# Patient Record
Sex: Female | Born: 1965 | Hispanic: No | Marital: Married | Smoking: Never smoker
Health system: Southern US, Community
[De-identification: ages and names within clinical notes are randomized; demographics above are authoritative.]

## PROBLEM LIST (undated history)

## (undated) DIAGNOSIS — F32A Depression, unspecified: Secondary | ICD-10-CM

## (undated) DIAGNOSIS — N979 Female infertility, unspecified: Secondary | ICD-10-CM

## (undated) DIAGNOSIS — R87619 Unspecified abnormal cytological findings in specimens from cervix uteri: Secondary | ICD-10-CM

## (undated) DIAGNOSIS — F329 Major depressive disorder, single episode, unspecified: Secondary | ICD-10-CM

## (undated) HISTORY — DX: Unspecified abnormal cytological findings in specimens from cervix uteri: R87.619

## (undated) HISTORY — DX: Major depressive disorder, single episode, unspecified: F32.9

## (undated) HISTORY — DX: Female infertility, unspecified: N97.9

## (undated) HISTORY — DX: Depression, unspecified: F32.A

## (undated) HISTORY — PX: WISDOM TOOTH EXTRACTION: SHX21

---

## 2007-02-13 ENCOUNTER — Other Ambulatory Visit: Admission: RE | Admit: 2007-02-13 | Discharge: 2007-02-13 | Payer: Self-pay | Admitting: Obstetrics & Gynecology

## 2007-04-10 ENCOUNTER — Ambulatory Visit (HOSPITAL_COMMUNITY): Admission: RE | Admit: 2007-04-10 | Discharge: 2007-04-10 | Payer: Self-pay | Admitting: Obstetrics & Gynecology

## 2007-04-15 ENCOUNTER — Encounter: Admission: RE | Admit: 2007-04-15 | Discharge: 2007-04-15 | Payer: Self-pay | Admitting: Obstetrics & Gynecology

## 2008-01-16 ENCOUNTER — Encounter: Admission: RE | Admit: 2008-01-16 | Discharge: 2008-01-16 | Payer: Self-pay | Admitting: Obstetrics & Gynecology

## 2008-03-18 ENCOUNTER — Other Ambulatory Visit: Admission: RE | Admit: 2008-03-18 | Discharge: 2008-03-18 | Payer: Self-pay | Admitting: Obstetrics and Gynecology

## 2008-04-26 ENCOUNTER — Encounter: Admission: RE | Admit: 2008-04-26 | Discharge: 2008-04-26 | Payer: Self-pay | Admitting: Obstetrics & Gynecology

## 2009-04-23 IMAGING — MG MM DIAGNOSTIC BILATERAL
1 series · 1 of 1 positions shown · non-contrast
Comparison: [DATE] [DATE], [DATE], [DATE] [DATE], [DATE], [DATE] [DATE], [DATE]

CLINICAL DATA: Short-term follow-up left focal asymmetry

DIGITAL DIAGNOSTIC BILATERAL MAMMOGRAM WITH CAD

[L CC]
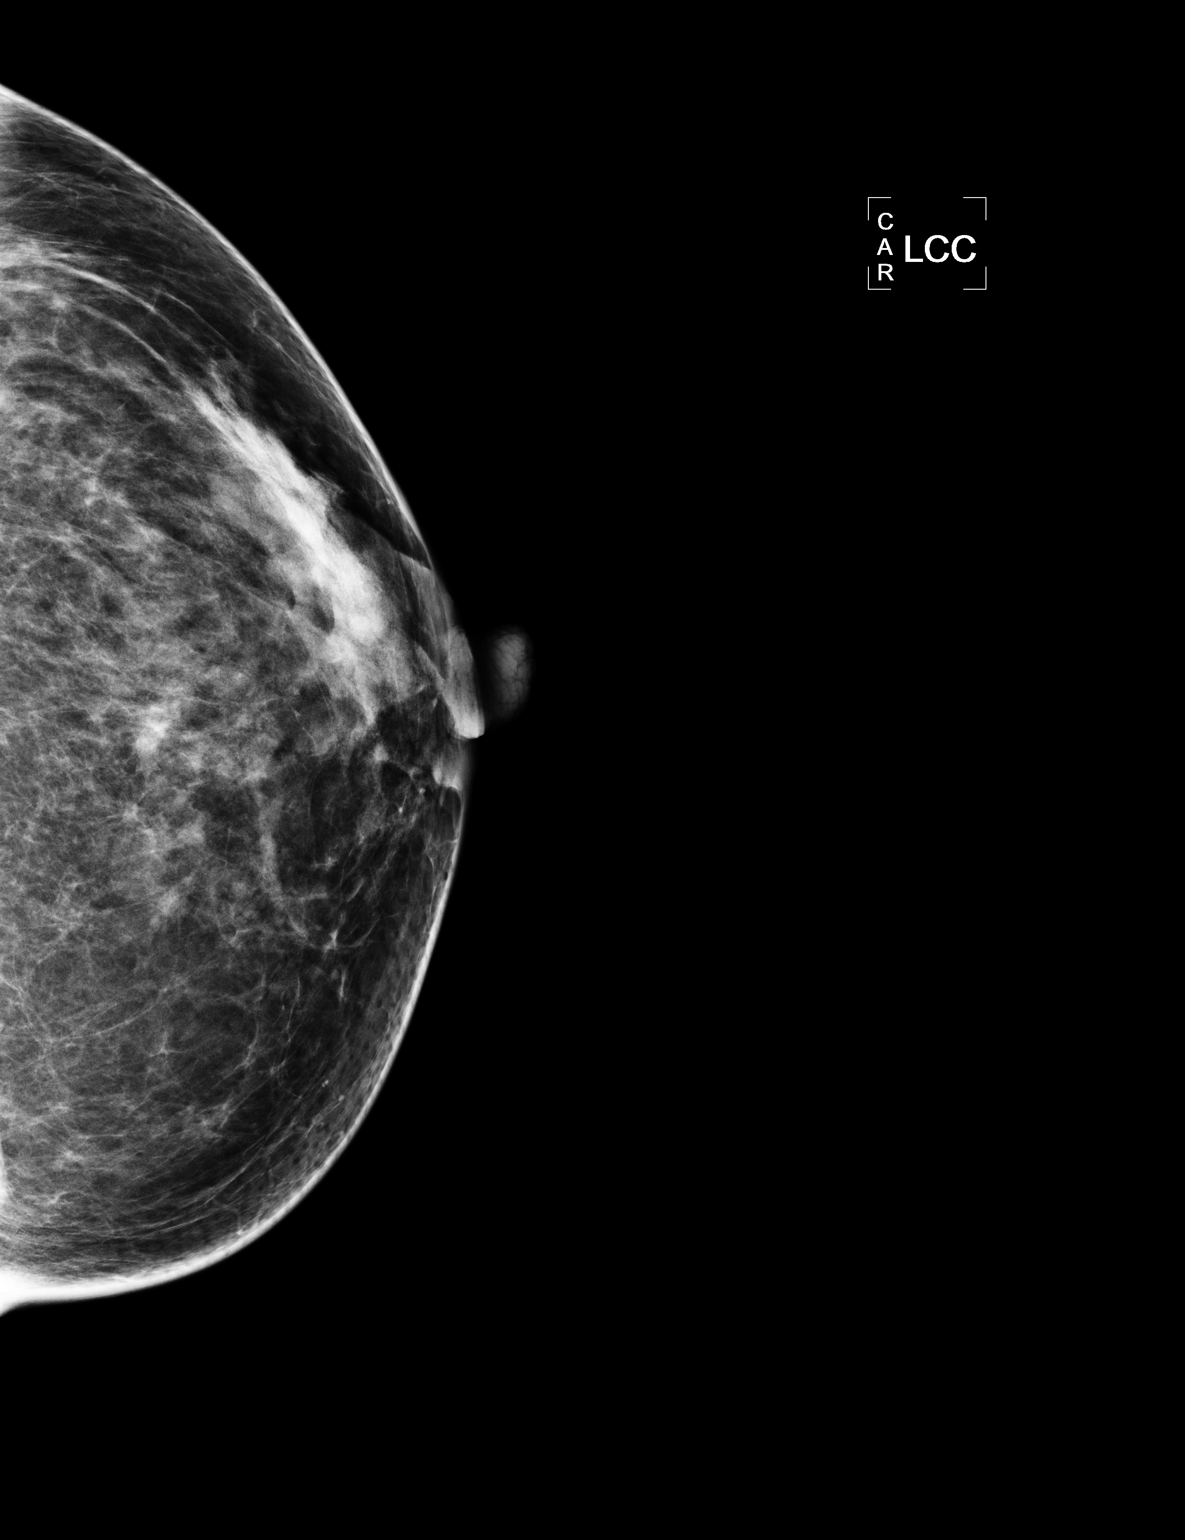

[1 of 1 positions shown; findings below may reference images not displayed]

FINDINGS: Scattered fibroglandular densities.  No suspicious
findings on either side.  Area of inferior asymmetry on the left
has resolved.
IMPRESSION: Area of prior questioned asymmetry does not persist.

BI-RADS CATEGORY 1:  Negative.

Recommendation:

Screening mammogram in 1 year

## 2009-06-08 ENCOUNTER — Encounter: Admission: RE | Admit: 2009-06-08 | Discharge: 2009-06-08 | Payer: Self-pay | Admitting: Obstetrics & Gynecology

## 2011-03-30 ENCOUNTER — Other Ambulatory Visit: Payer: Self-pay | Admitting: Obstetrics & Gynecology

## 2011-03-30 ENCOUNTER — Other Ambulatory Visit: Payer: Self-pay | Admitting: Obstetrics and Gynecology

## 2011-03-30 DIAGNOSIS — N6320 Unspecified lump in the left breast, unspecified quadrant: Secondary | ICD-10-CM

## 2011-04-06 ENCOUNTER — Ambulatory Visit
Admission: RE | Admit: 2011-04-06 | Discharge: 2011-04-06 | Disposition: A | Discharge status: Home / Self Care | Payer: 59 | Source: Ambulatory Visit | Attending: Obstetrics and Gynecology | Admitting: Obstetrics and Gynecology

## 2011-04-06 DIAGNOSIS — N6320 Unspecified lump in the left breast, unspecified quadrant: Secondary | ICD-10-CM

## 2012-10-02 ENCOUNTER — Other Ambulatory Visit (INDEPENDENT_AMBULATORY_CARE_PROVIDER_SITE_OTHER): Payer: 59

## 2012-10-02 ENCOUNTER — Other Ambulatory Visit: Payer: Self-pay | Admitting: Certified Nurse Midwife

## 2012-10-02 DIAGNOSIS — R6889 Other general symptoms and signs: Secondary | ICD-10-CM

## 2012-10-02 LAB — LIPID PANEL
Cholesterol: 230 mg/dL — ABNORMAL HIGH (ref 0–200)
HDL: 51 mg/dL (ref >39)
LDL Cholesterol: 161 mg/dL — ABNORMAL HIGH (ref 0–99)
Total CHOL/HDL Ratio: 4.5 Ratio
Triglycerides: 92 mg/dL (ref <150)
VLDL: 18 mg/dL (ref 0–40)

## 2012-10-06 ENCOUNTER — Telehealth: Payer: Self-pay

## 2012-10-06 NOTE — Telephone Encounter (Signed)
lmtcb

## 2012-10-06 NOTE — Telephone Encounter (Signed)
Message copied by Eliezer Bottom on Mon Oct 06, 2012  5:57 PM ------      Message from: Verner Chol      Created: Fri Oct 03, 2012  4:59 PM       Notify patient needs OV to discuss labs which are elevated for management plan ------

## 2012-10-07 NOTE — Telephone Encounter (Signed)
Pt returning call

## 2012-10-07 NOTE — Telephone Encounter (Signed)
Patient notified of results.

## 2012-10-21 ENCOUNTER — Encounter: Payer: Self-pay | Admitting: Certified Nurse Midwife

## 2012-10-21 ENCOUNTER — Ambulatory Visit (INDEPENDENT_AMBULATORY_CARE_PROVIDER_SITE_OTHER): Payer: 59 | Admitting: Certified Nurse Midwife

## 2012-10-21 VITALS — BP 98/60 | HR 64 | Resp 16 | Ht 69.0 in | Wt 169.0 lb

## 2012-10-21 DIAGNOSIS — R6889 Other general symptoms and signs: Secondary | ICD-10-CM

## 2012-10-21 NOTE — Progress Notes (Signed)
47 y.o. Married Caucasian female G0P0000 here for review of lipid panel results and nutritional review of diet. Patient takes multivitamin daily and omega 3 off and on. Patient also on Zoloft for anxiety, working well. Patient describes her diet as coffee in am, peanut butter, or sandwich at lunch if she eats and then eats out for evening meal with spouse. Drinks water and 2% milk, soda, occasional, juice rarely. "Feels like I eat well, but too much" chooses salad and vegetables and chicken, hot wings etc. Eats large amount of cheese daily with pasta if possible. Patient admits to not watching the fat intake, with butter use always. Exercises with work lifting, and walking.  Lipid panel: Cholesterol 230,  LDL 161, HDL 51, Triglycerides 92  O: Healthy WD,WN female  Weight 169, height 5'9" Affect: normal, orientation x3   A:Elevated Lipid profile  P: Discussed findings of lipid profile. Reviewed written diet suggestions for decrease in cholesterol in diet. Discussed at length food choices are the most important part to start and portion size. Questions answered at length. Discussed alternating exercise pattern for the most benefit. Encouraged to access calorie king app for phone to help with fat intake and salt and sugar selections when eating out. Start on Omega 3 daily and be consistent with use. Discussed her HDL is good but increasing it and decreasing LDL is the goal. Offered referral to nutrition declined. Patient plans to work on with spouse, "so we both can be healthy".  Labs :Repeat Lipid profile 6 months, order in  RV as above, prn   40 minutes spent with patient with >50% of time spent in face to face counseling.

## 2012-10-24 NOTE — Progress Notes (Signed)
Note reviewed, agree with plan.  Keyani Rigdon, MD  

## 2012-10-31 ENCOUNTER — Other Ambulatory Visit: Payer: Self-pay

## 2012-10-31 DIAGNOSIS — Z1231 Encounter for screening mammogram for malignant neoplasm of breast: Secondary | ICD-10-CM

## 2012-11-19 ENCOUNTER — Ambulatory Visit
Admission: RE | Admit: 2012-11-19 | Discharge: 2012-11-19 | Disposition: A | Discharge status: Home / Self Care | Payer: 59 | Source: Ambulatory Visit

## 2012-11-19 DIAGNOSIS — Z1231 Encounter for screening mammogram for malignant neoplasm of breast: Secondary | ICD-10-CM

## 2012-11-21 ENCOUNTER — Other Ambulatory Visit: Payer: Self-pay | Admitting: *Deleted

## 2012-11-21 ENCOUNTER — Other Ambulatory Visit: Payer: Self-pay | Admitting: Obstetrics & Gynecology

## 2012-11-21 DIAGNOSIS — R928 Other abnormal and inconclusive findings on diagnostic imaging of breast: Secondary | ICD-10-CM

## 2012-12-04 ENCOUNTER — Ambulatory Visit
Admission: RE | Admit: 2012-12-04 | Discharge: 2012-12-04 | Disposition: A | Discharge status: Home / Self Care | Payer: 59 | Source: Ambulatory Visit | Attending: Obstetrics & Gynecology | Admitting: Obstetrics & Gynecology

## 2012-12-04 DIAGNOSIS — R928 Other abnormal and inconclusive findings on diagnostic imaging of breast: Secondary | ICD-10-CM

## 2013-04-01 ENCOUNTER — Encounter: Payer: Self-pay | Admitting: Certified Nurse Midwife

## 2013-04-01 ENCOUNTER — Ambulatory Visit: Payer: Self-pay | Admitting: Certified Nurse Midwife

## 2013-06-11 ENCOUNTER — Encounter: Payer: Self-pay | Admitting: Certified Nurse Midwife

## 2013-06-11 ENCOUNTER — Telehealth: Payer: Self-pay | Admitting: Certified Nurse Midwife

## 2013-06-11 NOTE — Telephone Encounter (Signed)
Left message to call Stacey Arias at [phone]. Results to be given to patient are seen below.   Lab Results  Component Value Date   CHOL 230* 10/02/2012   HDL 51 10/02/2012   LDLCALC 161* 10/02/2012   TRIG 92 10/02/2012   CHOLHDL 4.5 10/02/2012

## 2013-06-11 NOTE — Telephone Encounter (Signed)
Patient wants to know what her cholesterol was the last time we checked it.

## 2013-06-12 NOTE — Telephone Encounter (Signed)
Left message to call Kaitlyn at [phone]. 

## 2013-06-15 NOTE — Telephone Encounter (Signed)
Spoke with patient. Patient states that she recently activated MyChart and is able to see her labs. Patient would like to know what her last triglyceride level was and what the level should be. Advised that last level was 92 and we like it to be lower than 150. Patient states that she went to the doctor last week and had a lot of labs drawn due to fatigue. Patient states that her doctor would like her to start on cholesterol medication and she would like to control with diet and exercise at this time. States she has office visit with Verner Choleborah S. Leonard CNM in two weeks and will talk further with her at that time. Advised patient to call back with any further questions or needs. Patient agreeable and verbalizes understanding.  Routing to provider for final review. Patient agreeable to disposition. Will close encounter

## 2013-06-22 ENCOUNTER — Ambulatory Visit: Payer: Self-pay | Admitting: Certified Nurse Midwife

## 2013-07-01 ENCOUNTER — Ambulatory Visit (INDEPENDENT_AMBULATORY_CARE_PROVIDER_SITE_OTHER): Payer: 59 | Admitting: Certified Nurse Midwife

## 2013-07-01 ENCOUNTER — Encounter: Payer: Self-pay | Admitting: Certified Nurse Midwife

## 2013-07-01 VITALS — BP 106/60 | HR 68 | Resp 16 | Ht 68.75 in | Wt 167.0 lb

## 2013-07-01 DIAGNOSIS — Z0000 Encounter for general adult medical examination without abnormal findings: Secondary | ICD-10-CM

## 2013-07-01 DIAGNOSIS — F411 Generalized anxiety disorder: Secondary | ICD-10-CM | POA: Insufficient documentation

## 2013-07-01 DIAGNOSIS — Z01419 Encounter for gynecological examination (general) (routine) without abnormal findings: Secondary | ICD-10-CM

## 2013-07-01 LAB — POCT URINALYSIS DIPSTICK
Bilirubin, UA: NEGATIVE
Blood, UA: NEGATIVE
Glucose, UA: NEGATIVE
Ketones, UA: NEGATIVE
Leukocytes, UA: NEGATIVE
Nitrite, UA: NEGATIVE
Protein, UA: NEGATIVE
Urobilinogen, UA: NEGATIVE
pH, UA: 5

## 2013-07-01 NOTE — Progress Notes (Signed)
48 y.o. G0P0000 Married Caucasian Fe here for annual exam.  Periods normal , no changes. Still stress with job changes. Patient went in to see Dr. Domingo PulseHucks for fatigue and did labs. Cholesterol much is better. Discussed with him anxiety and depression and now on Wellbutrin. "Feels so much better". Contraception none due to long history of infertility. 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 Working on diet to lower cholesterol, checked at PCP office and was down to 220 from 230 and LDL down from 161 to 131.  Will have recheck in 6 months with PCP. No other health issues today.  Patient's last menstrual period was 06/17/2013.          Sexually active: yes  The current method of family planning is none.    Exercising: yes  aerobics & weights Smoker:  no  Health Maintenance: Pap:  03-29-11 neg HPV HR neg MMG:  11-19-12 f/u 12-04-12 lt breast neg Colonoscopy:  none BMD:   none TDaP:  2011 Labs: Poct urine-neg Self breast exam: done occ   reports that she has never smoked. She does not have any smokeless tobacco history on file. She reports that she drinks alcohol. She reports that she does not use illicit drugs.  Past Medical History  Diagnosis Date  . Infertility, female   . Depression     Past Surgical History  Procedure Laterality Date  . Wisdom tooth extraction      Current Outpatient Prescriptions  Medication Sig Dispense Refill  . ALPRAZolam (XANAX) 0.5 MG tablet as needed.      Marland Kitchen. buPROPion (WELLBUTRIN SR) 100 MG 12 hr tablet daily.      . fish oil-omega-3 fatty acids 1000 MG capsule Take 2 g by mouth as needed.      . Multiple Vitamins-Minerals (MULTIVITAMIN PO) Take by mouth as needed.      . sertraline (ZOLOFT) 100 MG tablet Take 1/2 daily       No current facility-administered medications for this visit.    Family History  Problem Relation Age of Onset  . Breast cancer Sister   . Hypertension Mother     ROS:  Pertinent items are noted in HPI.  Otherwise, a  comprehensive ROS was negative.  Exam:   BP 106/60  Pulse 68  Resp 16  Ht 5' 8.75" (1.746 m)  Wt 167 lb (75.751 kg)  BMI 24.85 kg/m2  LMP 06/17/2013 Height: 5' 8.75" (174.6 cm)  Ht Readings from Last 3 Encounters:  07/01/13 5' 8.75" (1.746 m)  10/21/12 5\' 9"  (1.753 m)    General appearance: alert, cooperative and appears stated age Head: Normocephalic, without obvious abnormality, atraumatic Neck: no adenopathy, supple, symmetrical, trachea midline and thyroid normal to inspection and palpation and non-palpable Lungs: clear to auscultation bilaterally Breasts: normal appearance, no masses or tenderness, No nipple retraction or dimpling, No nipple discharge or bleeding, No axillary or supraclavicular adenopathy Heart: regular rate and rhythm, no murmur heard today Abdomen: soft, non-tender; no masses,  no organomegaly Extremities: extremities normal, atraumatic, no cyanosis or edema Skin: Skin color, texture, turgor normal. No rashes or lesions Lymph nodes: Cervical, supraclavicular, and axillary nodes normal. No abnormal inguinal nodes palpated Neurologic: Grossly normal   Pelvic: External genitalia:  no lesions              Urethra:  normal appearing urethra with no masses, tenderness or lesions              Bartholin's  and Skene's: normal                 Vagina: normal appearing vagina with normal color and discharge, no lesions              Cervix: normal, non tender              Pap taken: yes Bimanual Exam:  Uterus:  normal size, contour, position, consistency, mobility, non-tender and anteverted              Adnexa: normal adnexa and no mass, fullness, tenderness               Rectovaginal: Confirms               Anus:  normal sphincter tone, no lesions  A:  Well Woman with normal exam  Contraception none, long history or infertility  MVP per PCP no treatment needed  Elevated cholesterol responding to diet and weight changes, with PCP management now  P:   Reviewed  health and wellness pertinent to exam  Pap smear taken today with HPV reflex  Mammogram yearly stressed  Continue PCP follow up as indicated  counseled on breast self exam, mammography screening, adequate intake of calcium and vitamin D, diet and exercise  return annually or prn  An After Visit Summary was printed and given to the patient.

## 2013-07-01 NOTE — Patient Instructions (Signed)

## 2013-07-02 NOTE — Progress Notes (Signed)
Reviewed personally.  M. Suzanne Alcide Memoli, MD.  

## 2013-07-03 LAB — IPS PAP TEST WITH REFLEX TO HPV

## 2014-01-18 ENCOUNTER — Encounter: Payer: Self-pay | Admitting: Certified Nurse Midwife

## 2014-08-18 ENCOUNTER — Other Ambulatory Visit: Payer: Self-pay

## 2014-08-18 ENCOUNTER — Telehealth: Payer: Self-pay | Admitting: Certified Nurse Midwife

## 2014-08-18 DIAGNOSIS — Z803 Family history of malignant neoplasm of breast: Secondary | ICD-10-CM

## 2014-08-18 DIAGNOSIS — N6310 Unspecified lump in the right breast, unspecified quadrant: Secondary | ICD-10-CM

## 2014-08-18 NOTE — Telephone Encounter (Signed)
Spoke with patient. Patient states that 3-4 days ago she began to feel "tender" on right side side close to her breast and axilla. Yesterday she noticed a lump in the right breast that is tender to the touch. Patient's sister has a history of breast cancer. Patient denies any redness or warmth to the area. Advised will need to be seen in office for breast check before further imaging can be ordered. Patient is agreeable. Appointment scheduled for 6/17 at 10:15am with Dr.Miller. Patient is agreeable to date and time.  Cc: Verner Chol CNM   Routing to provider for final review. Patient agreeable to disposition. Will close encounter.

## 2014-08-18 NOTE — Telephone Encounter (Signed)
Patient found a lump in her right breast and needs an order for a diagnostic scan. Chart to triage.

## 2014-08-20 ENCOUNTER — Other Ambulatory Visit: Payer: Self-pay | Admitting: Obstetrics & Gynecology

## 2014-08-20 ENCOUNTER — Ambulatory Visit
Admission: RE | Admit: 2014-08-20 | Discharge: 2014-08-20 | Disposition: A | Discharge status: Home / Self Care | Payer: 59 | Source: Ambulatory Visit | Attending: Obstetrics & Gynecology | Admitting: Obstetrics & Gynecology

## 2014-08-20 ENCOUNTER — Ambulatory Visit (INDEPENDENT_AMBULATORY_CARE_PROVIDER_SITE_OTHER): Payer: 59 | Admitting: Obstetrics & Gynecology

## 2014-08-20 VITALS — BP 104/62 | HR 72 | Resp 16 | Wt 165.0 lb

## 2014-08-20 DIAGNOSIS — N644 Mastodynia: Secondary | ICD-10-CM

## 2014-08-20 DIAGNOSIS — Z1231 Encounter for screening mammogram for malignant neoplasm of breast: Secondary | ICD-10-CM

## 2014-08-20 NOTE — Progress Notes (Signed)
Scheduled patient for bilateral 3D screening mammogram at [facility] for 08/20/2014 at 4:20pm. Patient is agreeable to date and time. Placed in mammogram hold.

## 2014-08-20 NOTE — Progress Notes (Signed)
Subjective:     Patient ID: Stacey Arias, female   DOB: 1965/08/22, 49 y.o.   MRN: [id number]  HPI 49 yo G0 MWF here for complaint right breast tenderness that she noted about three days ago.  No definitive lump.  No recent trauma.  No nipple discharge.  She denies any visible skin changes.  Pt is still cycling with LMP 08/19/14.  Pt reports she feels she has made the pain worse as she keeps rubbing the area.  Does have a sister with breast cancer.  Sister is 4 years old.    Has not had a mammogram since 12/04/12.  This was normal.  Tried to call the breast center for appt and was told she needed to be seen first in our office.  Here for recommendations.    Review of Systems  All other systems reviewed and are negative.      Objective:   Physical Exam  Constitutional: She appears well-developed and well-nourished.  Neck: Normal range of motion. No tracheal deviation present. No thyromegaly present.  Cardiovascular: Normal rate and regular rhythm.   Pulmonary/Chest: Effort normal and breath sounds normal. Right breast exhibits tenderness. Right breast exhibits no inverted nipple, no mass, no nipple discharge and no skin change. Left breast exhibits no inverted nipple, no mass, no nipple discharge, no skin change and no tenderness. Breasts are symmetrical.    Lymphadenopathy:    She has no cervical adenopathy.       Assessment:     Right breast pain Heightened anxiety due to sister with hx of breast cancer Overdue for MMG     Plan:     No abnormality noted on physical exam today.  Will just plan to proceed with 3D MMG for screening.  Pt knows to call for follow up exam if pain continues throughout next month and past next menstrual cycle.  Do not feel diagnostic imaging necessary.

## 2014-09-01 ENCOUNTER — Encounter: Payer: Self-pay | Admitting: Obstetrics & Gynecology

## 2015-01-18 DIAGNOSIS — E782 Mixed hyperlipidemia: Secondary | ICD-10-CM | POA: Insufficient documentation

## 2015-08-09 ENCOUNTER — Ambulatory Visit (INDEPENDENT_AMBULATORY_CARE_PROVIDER_SITE_OTHER): Payer: 59 | Admitting: Certified Nurse Midwife

## 2015-08-09 ENCOUNTER — Encounter: Payer: Self-pay | Admitting: Certified Nurse Midwife

## 2015-08-09 VITALS — BP 110/70 | HR 70 | Resp 16 | Ht 68.75 in | Wt 165.0 lb

## 2015-08-09 DIAGNOSIS — Z01419 Encounter for gynecological examination (general) (routine) without abnormal findings: Secondary | ICD-10-CM | POA: Diagnosis not present

## 2015-08-09 DIAGNOSIS — Z0000 Encounter for general adult medical examination without abnormal findings: Secondary | ICD-10-CM | POA: Diagnosis not present

## 2015-08-09 DIAGNOSIS — Z124 Encounter for screening for malignant neoplasm of cervix: Secondary | ICD-10-CM | POA: Diagnosis not present

## 2015-08-09 DIAGNOSIS — Z1211 Encounter for screening for malignant neoplasm of colon: Secondary | ICD-10-CM | POA: Diagnosis not present

## 2015-08-09 NOTE — Progress Notes (Signed)
50 y.o. G0P0000 Married  Caucasian Fe here for annual exam.Periods normal,no issues. Occasional night sweats, no issues. Having episodes of hypoglycemia, so keeping food snacks available, works well. Sees Dr. Gaylyn LambertHux yearly for aex, labs, and medication management for anxiety and depression. 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 No other health issues today. Still working with [employer] !  Patient's last menstrual period was 08/09/2015.          Sexually active: Yes.    The current method of family planning is none.    Exercising: Yes.    working in yard, walking dogs Smoker:  no  Health Maintenance: Pap: 07-01-13 neg MMG: 08-23-14 category b density, birads 1:neg Colonoscopy:   BMD:   none TDaP:  2011 Shingles: no Pneumonia: no Hep C and HIV: HIV done yrs ago 1996 neg Labs: none Self breast exam: done occ   reports that she has never smoked. She does not have any smokeless tobacco history on file. She reports that she drinks about 0.6 - 1.2 oz of alcohol per week. She reports that she does not use illicit drugs.  Past Medical History  Diagnosis Date  . Infertility, female   . Depression     Past Surgical History  Procedure Laterality Date  . Wisdom tooth extraction      Current Outpatient Prescriptions  Medication Sig Dispense Refill  . ALPRAZolam (XANAX) 0.5 MG tablet as needed.    Marland Kitchen. buPROPion (WELLBUTRIN XL) 300 MG 24 hr tablet Take 300 mg by mouth.    . sertraline (ZOLOFT) 100 MG tablet      No current facility-administered medications for this visit.    Family History  Problem Relation Age of Onset  . Breast cancer Sister   . Hypertension Mother     ROS:  Pertinent items are noted in HPI.  Otherwise, a comprehensive ROS was negative.  Exam:   BP 110/70 mmHg  Pulse 70  Resp 16  Ht 5' 8.75" (1.746 m)  Wt 165 lb (74.844 kg)  BMI 24.55 kg/m2  LMP 08/09/2015 Height: 5' 8.75" (174.6 cm) Ht Readings from Last 3 Encounters:  08/09/15 5' 8.75" (1.746  m)  07/01/13 5' 8.75" (1.746 m)  10/21/12 5\' 9"  (1.753 m)    General appearance: alert, cooperative and appears stated age Head: Normocephalic, without obvious abnormality, atraumatic Neck: no adenopathy, supple, symmetrical, trachea midline and thyroid normal to inspection and palpation Lungs: clear to auscultation bilaterally Breasts: normal appearance, no masses or tenderness, No nipple retraction or dimpling, No nipple discharge or bleeding, No axillary or supraclavicular adenopathy Heart: regular rate and rhythm Abdomen: soft, non-tender; no masses,  no organomegaly Extremities: extremities normal, atraumatic, no cyanosis or edema Skin: Skin color, texture, turgor normal. No rashes or lesions Lymph nodes: Cervical, supraclavicular, and axillary nodes normal. No abnormal inguinal nodes palpated Neurologic: Grossly normal   Pelvic: External genitalia:  no lesions              Urethra:  normal appearing urethra with no masses, tenderness or lesions              Bartholin's and Skene's: normal                 Vagina: normal appearing vagina with normal color and discharge, no lesions              Cervix: no cervical motion tenderness, no lesions and nulliparous appearance  Pap taken: Yes.   Bimanual Exam:  Uterus:  normal size, contour, position, consistency, mobility, non-tender and mid position              Adnexa: normal adnexa and no mass, fullness, tenderness               Rectovaginal: Confirms               Anus:  normal sphincter tone, no lesions  Chaperone present: yes  A:  Well Woman with normal exam  Contraception long history of infertility  Colonoscopy due  MVP,anxiety and depression management with PCP  Screening labs  P:   Reviewed health and wellness pertinent to exam  Discussed risks and benefits of colonoscopy, requests referral.  Questions addressed. Patient will be called with information on appointment.  Continue follow with PCP as  indicated.  Lab Hep C  Pap smear as above with HPVHR   counseled on breast self exam, mammography screening, menopause, adequate intake of calcium and vitamin D, diet and exercise  return annually or prn  An After Visit Summary was printed and given to the patient.

## 2015-08-09 NOTE — Patient Instructions (Signed)
EXERCISE AND DIET:  We recommended that you start or continue a regular exercise program for good health. Regular exercise means any activity that makes your heart beat faster and makes you sweat.  We recommend exercising at least 30 minutes per day at least 3 days a week, preferably 4 or 5.  We also recommend a diet low in fat and sugar.  Inactivity, poor dietary choices and obesity can cause diabetes, heart attack, stroke, and kidney damage, among others.    ALCOHOL AND SMOKING:  Women should limit their alcohol intake to no more than 7 drinks/beers/glasses of wine (combined, not each!) per week. Moderation of alcohol intake to this level decreases your risk of breast cancer and liver damage. And of course, no recreational drugs are part of a healthy lifestyle.  And absolutely no smoking or even second hand smoke. Most people know smoking can cause heart and lung diseases, but did you know it also contributes to weakening of your bones? Aging of your skin?  Yellowing of your teeth and nails?  CALCIUM AND VITAMIN D:  Adequate intake of calcium and Vitamin D are recommended.  The recommendations for exact amounts of these supplements seem to change often, but generally speaking 600 mg of calcium (either carbonate or citrate) and 800 units of Vitamin D per day seems prudent. Certain women may benefit from higher intake of Vitamin D.  If you are among these women, your doctor will have told you during your visit.    PAP SMEARS:  Pap smears, to check for cervical cancer or precancers,  have traditionally been done yearly, although recent scientific advances have shown that most women can have pap smears less often.  However, every woman still should have a physical exam from her gynecologist every year. It will include a breast check, inspection of the vulva and vagina to check for abnormal growths or skin changes, a visual exam of the cervix, and then an exam to evaluate the size and shape of the uterus and  ovaries.  And after 52 years of age, a rectal exam is indicated to check for rectal cancers. We will also provide age appropriate advice regarding health maintenance, like when you should have certain vaccines, screening for sexually transmitted diseases, bone density testing, colonoscopy, mammograms, etc.   MAMMOGRAMS:  All women over 52 years old should have a yearly mammogram. Many facilities now offer a "3D" mammogram, which may cost around $50 extra out of pocket. If possible,  we recommend you accept the option to have the 3D mammogram performed.  It both reduces the number of women who will be called back for extra views which then turn out to be normal, and it is better than the routine mammogram at detecting truly abnormal areas.    COLONOSCOPY:  Colonoscopy to screen for colon cancer is recommended for all women at age 52.  We know, you hate the idea of the prep.  We agree, BUT, having colon cancer and not knowing it is worse!!  Colon cancer so often starts as a polyp that can be seen and removed at colonscopy, which can quite literally save your life!  And if your first colonoscopy is normal and you have no family history of colon cancer, most women don't have to have it again for 10 years.  Once every ten years, you can do something that may end up saving your life, right?  We will be happy to help you get it scheduled when you are ready.    Be sure to check your insurance coverage so you understand how much it will cost.  It may be covered as a preventative service at no cost, but you should check your particular policy.     Colonoscopy A colonoscopy is an exam to look at the entire large intestine (colon). This exam can help find problems such as tumors, polyps, inflammation, and areas of bleeding. The exam takes about 1 hour.  LET YOUR HEALTH CARE PROVIDER KNOW ABOUT:   Any allergies you have.  All medicines you are taking, including vitamins, herbs, eye drops, creams, and over-the-counter  medicines.  Previous problems you or members of your family have had with the use of anesthetics.  Any blood disorders you have.  Previous surgeries you have had.  Medical conditions you have. RISKS AND COMPLICATIONS  Generally, this is a safe procedure. However, as with any procedure, complications can occur. Possible complications include:  Bleeding.  Tearing or rupture of the colon wall.  Reaction to medicines given during the exam.  Infection (rare). BEFORE THE PROCEDURE   Ask your health care provider about changing or stopping your regular medicines.  You may be prescribed an oral bowel prep. This involves drinking a large amount of medicated liquid, starting the day before your procedure. The liquid will cause you to have multiple loose stools until your stool is almost clear or light green. This cleans out your colon in preparation for the procedure.  Do not eat or drink anything else once you have started the bowel prep, unless your health care provider tells you it is safe to do so.  Arrange for someone to drive you home after the procedure. PROCEDURE   You will be given medicine to help you relax (sedative).  You will lie on your side with your knees bent.  A long, flexible tube with a light and camera on the end (colonoscope) will be inserted through the rectum and into the colon. The camera sends video back to a computer screen as it moves through the colon. The colonoscope also releases carbon dioxide gas to inflate the colon. This helps your health care provider see the area better.  During the exam, your health care provider may take a small tissue sample (biopsy) to be examined under a microscope if any abnormalities are found.  The exam is finished when the entire colon has been viewed. AFTER THE PROCEDURE   Do not drive for 24 hours after the exam.  You may have a small amount of blood in your stool.  You may pass moderate amounts of gas and have mild  abdominal cramping or bloating. This is caused by the gas used to inflate your colon during the exam.  Ask when your test results will be ready and how you will get your results. Make sure you get your test results.   This information is not intended to replace advice given to you by your health care provider. Make sure you discuss any questions you have with your health care provider.   Document Released: 02/17/2000 Document Revised: 12/10/2012 Document Reviewed: 10/27/2012 Elsevier Interactive Patient Education 2016 [employer] Inc[employer]  

## 2015-08-10 LAB — HEPATITIS C ANTIBODY: HCV Ab: NEGATIVE

## 2015-08-10 NOTE — Progress Notes (Signed)
Reviewed personally.  M. Suzanne Orelia Brandstetter, MD.  

## 2015-08-11 LAB — IPS PAP TEST WITH HPV

## 2015-10-14 ENCOUNTER — Other Ambulatory Visit: Payer: Self-pay | Admitting: Certified Nurse Midwife

## 2015-10-14 DIAGNOSIS — Z1231 Encounter for screening mammogram for malignant neoplasm of breast: Secondary | ICD-10-CM

## 2015-10-18 ENCOUNTER — Ambulatory Visit
Admission: RE | Admit: 2015-10-18 | Discharge: 2015-10-18 | Disposition: A | Discharge status: Home / Self Care | Payer: 59 | Source: Ambulatory Visit | Attending: Certified Nurse Midwife | Admitting: Certified Nurse Midwife

## 2015-10-18 DIAGNOSIS — Z1231 Encounter for screening mammogram for malignant neoplasm of breast: Secondary | ICD-10-CM

## 2015-10-19 ENCOUNTER — Telehealth: Payer: Self-pay | Admitting: Certified Nurse Midwife

## 2015-10-19 NOTE — Telephone Encounter (Signed)
Patient called and left a message on the answering machine after hours. She said, "[facility] but I told them I had a lump and they said I will need a referral from my doctor to be seen. Please send a referral to them so I can take care of this."  I called the patient this morning and left a message to call back to schedule an [facility].  Last AEX 08/09/15.

## 2015-10-20 ENCOUNTER — Encounter: Payer: Self-pay | Admitting: Certified Nurse Midwife

## 2015-10-20 ENCOUNTER — Ambulatory Visit (INDEPENDENT_AMBULATORY_CARE_PROVIDER_SITE_OTHER): Payer: 59 | Admitting: Certified Nurse Midwife

## 2015-10-20 VITALS — BP 100/60 | HR 68 | Resp 16 | Ht 68.75 in | Wt 166.0 lb

## 2015-10-20 DIAGNOSIS — N6310 Unspecified lump in the right breast, unspecified quadrant: Secondary | ICD-10-CM

## 2015-10-20 DIAGNOSIS — N63 Unspecified lump in breast: Secondary | ICD-10-CM | POA: Diagnosis not present

## 2015-10-20 NOTE — Progress Notes (Signed)
   Subjective:   50 y.o. MarriedCaucasian female presents for evaluation of right breast mass. Onset of the symptoms wasday. Patient sought evaluation because of mass with SBE, but unable to feel now.  Contributing factors include sister with breast CA. Denies negative.. Patient denies hiistory of trauma, bites, or injuries. Last mammogram was 08/20/14.Marland Kitchen.  Previous evaluation has includedno workup   Review of Systems Pertinent items are noted in HPI.  Objective:   General appearance: alert, cooperative and appears stated age Breasts: normal appearance, no masses or tenderness, No nipple retraction or dimpling, No nipple discharge or bleeding, No axillary or supraclavicular adenopathy, positive findings: right breast at 5 o'clock pea size mass, cystic feel    Assessment:   ASSESSMENT:Patient is diagnosed with right breast mass   Plan:   PLAN: Discussed finding and need evaluation with diagnostic mammogram and US. Patient agreeable.

## 2015-10-20 NOTE — Progress Notes (Signed)
Scheduled patient while in office for bilateral diagnostic mamm[facility] at 3:30pm with 3:10 pm arrival. She is agreeable to date and time. Placed in mammogram hold.

## 2015-10-21 NOTE — Telephone Encounter (Signed)
Patient was seen in the office on 10/20/2015 for breast check with Leota Sauerseborah Leonard CNM. Please see OV note.

## 2015-10-22 NOTE — Progress Notes (Signed)
Encounter reviewed Jill Jertson, MD   

## 2015-10-24 ENCOUNTER — Ambulatory Visit
Admission: RE | Admit: 2015-10-24 | Discharge: 2015-10-24 | Disposition: A | Discharge status: Home / Self Care | Payer: 59 | Source: Ambulatory Visit | Attending: Certified Nurse Midwife | Admitting: Certified Nurse Midwife

## 2015-10-24 DIAGNOSIS — N6310 Unspecified lump in the right breast, unspecified quadrant: Secondary | ICD-10-CM

## 2017-02-14 ENCOUNTER — Encounter: Payer: Self-pay | Admitting: Certified Nurse Midwife

## 2017-02-14 ENCOUNTER — Other Ambulatory Visit: Payer: Self-pay

## 2017-02-14 ENCOUNTER — Ambulatory Visit (INDEPENDENT_AMBULATORY_CARE_PROVIDER_SITE_OTHER): Payer: 59 | Admitting: Certified Nurse Midwife

## 2017-02-14 VITALS — BP 112/70 | HR 68 | Resp 16 | Ht 69.0 in | Wt 168.0 lb

## 2017-02-14 DIAGNOSIS — E559 Vitamin D deficiency, unspecified: Secondary | ICD-10-CM

## 2017-02-14 DIAGNOSIS — Z0000 Encounter for general adult medical examination without abnormal findings: Secondary | ICD-10-CM

## 2017-02-14 DIAGNOSIS — Z01419 Encounter for gynecological examination (general) (routine) without abnormal findings: Secondary | ICD-10-CM | POA: Diagnosis not present

## 2017-02-14 NOTE — Progress Notes (Signed)
51 y.o. G0P0000 Married  Caucasian Fe here for annual exam. Periods normal no issues. No hot flashes or night sweats.Sees [facility] for headaches management and anxiety,  and aex. Mother diagnosed with atrial fibrillation and stable at present. Sister still doing well ( patient here). Plans dermatology visit for skin check, due to moles. Also considering seeing Plastic Surgeon for skin lift.  No other health concerns today. 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  Patient's last menstrual period was 01/22/2017 (exact date).          Sexually active: Yes.    The current method of family planning is none.    Exercising: Yes.    walk Smoker:  no  Health Maintenance: Pap:  07-01-13 neg, 08-09-15 neg HPV HR neg History of Abnormal Pap: yes MMG:  8/17 bilateral & left breast u/s category b density birads 1:neg will schedule Self Breast exams: yes Colonoscopy:  2017 f/u 484yrs BMD:   none TDaP:  2011 Shingles: no Pneumonia: no Hep C and HIV: Hep c neg 2017, HIV neg yrs ago Labs: no   reports that  has never smoked. she has never used smokeless tobacco. She reports that she drinks alcohol. She reports that she does not use drugs.  Past Medical History:  Diagnosis Date  . Depression   . Infertility, female     Past Surgical History:  Procedure Laterality Date  . WISDOM TOOTH EXTRACTION      Current Outpatient Medications  Medication Sig Dispense Refill  . ALPRAZolam (XANAX) 0.5 MG tablet as needed.    Marland Kitchen. buPROPion (WELLBUTRIN XL) 300 MG 24 hr tablet Take 300 mg by mouth.    . sertraline (ZOLOFT) 100 MG tablet      No current facility-administered medications for this visit.     Family History  Problem Relation Age of Onset  . Breast cancer Sister   . Hypertension Mother     ROS:  Pertinent items are noted in HPI.  Otherwise, a comprehensive ROS was negative.  Exam:   BP 112/70   Pulse 68   Resp 16   Ht 5\' 9"  (1.753 m)   Wt 168 lb (76.2 kg)   LMP 01/22/2017  (Exact Date)   BMI 24.81 kg/m  Height: 5\' 9"  (175.3 cm) Ht Readings from Last 3 Encounters:  02/14/17 5\' 9"  (1.753 m)  10/20/15 5' 8.75" (1.746 m)  08/09/15 5' 8.75" (1.746 m)    General appearance: alert, cooperative and appears stated age Head: Normocephalic, without obvious abnormality, atraumatic Neck: no adenopathy, supple, symmetrical, trachea midline and thyroid normal to inspection and palpation Lungs: clear to auscultation bilaterally Breasts: normal appearance, no masses or tenderness, Inspection negative, No nipple retraction or dimpling, No nipple discharge or bleeding Heart: regular rate and rhythm Abdomen: soft, non-tender; no masses,  no organomegaly Extremities: extremities normal, atraumatic, no cyanosis or edema Skin: Skin color, texture, turgor normal. No rashes or lesions Lymph nodes: Cervical, supraclavicular, and axillary nodes normal. No abnormal inguinal nodes palpated Neurologic: Grossly normal   Pelvic: External genitalia:  no lesions              Urethra:  normal appearing urethra with no masses, tenderness or lesions              Bartholin's and Skene's: normal                 Vagina: normal appearing vagina with normal color and discharge, no lesions  Cervix: no cervical motion tenderness and no lesions              Pap taken: No. Bimanual Exam:  Uterus:  normal size, contour, position, consistency, mobility, non-tender              Adnexa: normal adnexa and no mass, fullness, tenderness               Rectovaginal: Confirms               Anus:  normal sphincter tone, no lesions  Chaperone present: yes  A:  Well Woman with normal exam  Contraception history of infertility  Sees PCP for anxiety and hyperlipidemia management  Screening labs    P:   Reviewed health and wellness pertinent to exam  Discussed perimenopausal changes and expectations and given printed information  Continue follow up with PCP as indicated  [id number]:Lipid panel,  CMP, TSH, Vitamin D  Pap smear: no   counseled on breast self exam, mammography screening, adequate intake of calcium and vitamin D, diet and exercise  return annually or prn  An After Visit Summary was printed and given to the patient.

## 2017-02-14 NOTE — Patient Instructions (Signed)

## 2017-02-15 ENCOUNTER — Telehealth: Payer: Self-pay | Admitting: *Deleted

## 2017-02-15 ENCOUNTER — Other Ambulatory Visit: Payer: Self-pay | Admitting: Certified Nurse Midwife

## 2017-02-15 DIAGNOSIS — E559 Vitamin D deficiency, unspecified: Secondary | ICD-10-CM

## 2017-02-15 LAB — VITAMIN D 25 HYDROXY (VIT D DEFICIENCY, FRACTURES): Vit D, 25-Hydroxy: 25.4 ng/mL — ABNORMAL LOW (ref 30.0–100.0)

## 2017-02-15 LAB — COMPREHENSIVE METABOLIC PANEL
ALT: 13 IU/L (ref 0–32)
AST: 17 IU/L (ref 0–40)
Albumin/Globulin Ratio: 1.9 (ref 1.2–2.2)
Albumin: 4.8 g/dL (ref 3.5–5.5)
Alkaline Phosphatase: 66 IU/L (ref 39–117)
BUN/Creatinine Ratio: 18 (ref 9–23)
BUN: 15 mg/dL (ref 6–24)
Bilirubin Total: 0.4 mg/dL (ref 0.0–1.2)
CO2: 25 mmol/L (ref 20–29)
Calcium: 9.7 mg/dL (ref 8.7–10.2)
Chloride: 102 mmol/L (ref 96–106)
Creatinine, Ser: 0.85 mg/dL (ref 0.57–1.00)
GFR calc Af Amer: 90 mL/min/{1.73_m2} (ref >59)
GFR calc non Af Amer: 78 mL/min/{1.73_m2} (ref >59)
Globulin, Total: 2.5 g/dL (ref 1.5–4.5)
Glucose: 78 mg/dL (ref 65–99)
Potassium: 4.6 mmol/L (ref 3.5–5.2)
Sodium: 143 mmol/L (ref 134–144)
Total Protein: 7.3 g/dL (ref 6.0–8.5)

## 2017-02-15 LAB — LIPID PANEL
Chol/HDL Ratio: 5.6 ratio — ABNORMAL HIGH (ref 0.0–4.4)
Cholesterol, Total: 271 mg/dL — ABNORMAL HIGH (ref 100–199)
HDL: 48 mg/dL (ref >39)
LDL Calculated: 194 mg/dL — ABNORMAL HIGH (ref 0–99)
Triglycerides: 147 mg/dL (ref 0–149)
VLDL Cholesterol Cal: 29 mg/dL (ref 5–40)

## 2017-02-15 LAB — TSH: TSH: 1.76 u[IU]/mL (ref 0.450–4.500)

## 2017-02-15 NOTE — Telephone Encounter (Signed)
Copy of labs dated 02/14/17 faxed to Dr. Gaylyn LambertHux at [phone].  Routing to provider for final review. Patient is agreeable to disposition. Will close encounter.

## 2017-02-15 NOTE — Telephone Encounter (Signed)
Notes recorded by Leda MinHamm, Quatisha Zylka N, RN on 02/15/2017 at 1:45 PM EST Left message to call Noreene LarssonJill at [phone]. See telephone encounter dated 02/15/17. ------  Notes recorded by Verner CholLeonard, Deborah S, CNM on 02/15/2017 at 12:29 PM EST Notify patient that her Vitamin D is low, which contributes to depression and fatigue. Start Vitamin D 3 1000 IU daily and recheck in 3 months order in TSH is normal Lipid panel is elevated at 271 for cholesterol and 194 for LDL cholesterol, HDL helpful cholesterol is normal at 48. She has PCP with [facility] and needs to follow up with them regarding management of this. It is important. If she needs help with appointment please schedule and will need copy of labs Liver, kidney and glucose profile is normal.

## 2017-02-15 NOTE — Telephone Encounter (Signed)
Spoke with patient, advised as seen below per Leota Sauerseborah Leonard, CNM. Patient scheduled for lab appt on 05/15/17 at 2:30pm. Patient states she plans to check with PCP- Dr. Gaylyn LambertHux to see if Vit D can be drawn at his office. Advised patient if PCP orders Vit D, request copy of results be faxed to office, return call to cancel lab appt. Patient request copy of labs dated 02/14/17 be faxed to Dr. Gaylyn LambertHux. Declined assistance with scheduling. Patient verbalizes understanding and is agreeable.

## 2017-05-14 ENCOUNTER — Telehealth: Payer: Self-pay | Admitting: Certified Nurse Midwife

## 2017-05-14 NOTE — Telephone Encounter (Addendum)
Patient canceled her vitamin d appointment 05/15/17. Patient to call to reschedule

## 2017-05-15 ENCOUNTER — Other Ambulatory Visit: Payer: Self-pay

## 2017-05-22 NOTE — Telephone Encounter (Signed)
Left another message for patient to call and reschedule her vitamin d appointment.

## 2017-05-23 NOTE — Telephone Encounter (Signed)
Left another message for patient to call and reschedule her vitamin d appointment. Okay to close encounter?

## 2017-05-25 NOTE — Telephone Encounter (Signed)
Yes ok to close

## 2018-02-20 ENCOUNTER — Encounter: Payer: Self-pay | Admitting: Certified Nurse Midwife

## 2018-02-20 ENCOUNTER — Other Ambulatory Visit: Payer: Self-pay

## 2018-02-20 ENCOUNTER — Ambulatory Visit (INDEPENDENT_AMBULATORY_CARE_PROVIDER_SITE_OTHER): Payer: 59 | Admitting: Certified Nurse Midwife

## 2018-02-20 ENCOUNTER — Other Ambulatory Visit (HOSPITAL_COMMUNITY)
Admission: RE | Admit: 2018-02-20 | Discharge: 2018-02-20 | Disposition: A | Discharge status: Home / Self Care | Payer: 59 | Source: Ambulatory Visit | Attending: Obstetrics & Gynecology | Admitting: Obstetrics & Gynecology

## 2018-02-20 VITALS — BP 106/68 | HR 68 | Resp 16 | Ht 68.75 in | Wt 173.0 lb

## 2018-02-20 DIAGNOSIS — Z124 Encounter for screening for malignant neoplasm of cervix: Secondary | ICD-10-CM | POA: Diagnosis not present

## 2018-02-20 DIAGNOSIS — Z803 Family history of malignant neoplasm of breast: Secondary | ICD-10-CM | POA: Diagnosis not present

## 2018-02-20 DIAGNOSIS — Z01419 Encounter for gynecological examination (general) (routine) without abnormal findings: Secondary | ICD-10-CM | POA: Diagnosis not present

## 2018-02-20 NOTE — Progress Notes (Signed)
52 y.o. G0P0000 Married  Caucasian Fe here for annual exam. Periods still monthly and normal with some cramping, no change. Occasional hot flash at night. Sees Dr. Gaylyn LambertHux for aex, labs, cholesterol and anxiety/depression. All stable at present. Family doing well. No health issues today.    LMP  01/26/18          Sexually active: Yes.    The current method of family planning is none.    Exercising: Yes.    walk Smoker:  no  Review of Systems  Constitutional: Negative.   HENT: Negative.   Eyes: Negative.   Respiratory: Negative.   Cardiovascular: Negative.   Gastrointestinal: Negative.   Genitourinary: Negative.   Musculoskeletal: Negative.   Skin: Negative.   Neurological: Negative.   Endo/Heme/Allergies: Negative.   Psychiatric/Behavioral: Negative.     Health Maintenance: Pap:  08-09-15 neg HPV HR neg History of Abnormal Pap: yes MMG:  2018 neg per patient, to sign release, Self Breast exams: yes Colonoscopy:  2017 f/u 8238yrs BMD:   none TDaP:  2011 Shingles: no Pneumonia: no Hep C and HIV: Hep c neg 2017, HIV neg yrs ago Labs: with PCP   reports that she has never smoked. She has never used smokeless tobacco. She reports current alcohol use. She reports that she does not use drugs.  Past Medical History:  Diagnosis Date  . Abnormal Pap smear of cervix    long time ago  . Depression   . Infertility, female     Past Surgical History:  Procedure Laterality Date  . WISDOM TOOTH EXTRACTION      Current Outpatient Medications  Medication Sig Dispense Refill  . ALPRAZolam (XANAX) 0.5 MG tablet as needed.    Marland Kitchen. buPROPion (WELLBUTRIN XL) 300 MG 24 hr tablet Take 300 mg by mouth.    . sertraline (ZOLOFT) 100 MG tablet      No current facility-administered medications for this visit.     Family History  Problem Relation Age of Onset  . Breast cancer Sister   . Hypertension Mother     ROS:  Pertinent items are noted in HPI.  Otherwise, a comprehensive ROS was  negative.  Exam:   There were no vitals taken for this visit.   Ht Readings from Last 3 Encounters:  02/14/17 5\' 9"  (1.753 m)  10/20/15 5' 8.75" (1.746 m)  08/09/15 5' 8.75" (1.746 m)    General appearance: alert, cooperative and appears stated age Head: Normocephalic, without obvious abnormality, atraumatic Neck: no adenopathy, supple, symmetrical, trachea midline and thyroid normal to inspection and palpation Lungs: clear to auscultation bilaterally Breasts: normal appearance, no masses or tenderness, No nipple retraction or dimpling, No nipple discharge or bleeding, No axillary or supraclavicular adenopathy Heart: regular rate and rhythm Abdomen: soft, non-tender; no masses,  no organomegaly Extremities: extremities normal, atraumatic, no cyanosis or edema Skin: Skin color, texture, turgor normal. No rashes or lesions Lymph nodes: Cervical, supraclavicular, and axillary nodes normal. No abnormal inguinal nodes palpated Neurologic: Grossly normal   Pelvic: External genitalia:  no lesions              Urethra:  normal appearing urethra with no masses, tenderness or lesions              Bartholin's and Skene's: normal                 Vagina: normal appearing vagina with normal color and discharge, no lesions  Cervix: no cervical motion tenderness, no lesions and nulliparous appearance              Pap taken: Yes.   Bimanual Exam:  Uterus:  normal size, contour, position, consistency, mobility, non-tender and anteverted              Adnexa: normal adnexa and no mass, fullness, tenderness               Rectovaginal: Confirms               Anus:  normal sphincter tone, no lesions  Chaperone present: yes  A:  Well Woman with normal exam  Perimenopausal with regular periods  Anxiety and depression with PCP management  Family history of breast cancer sister      P:   Reviewed health and wellness pertinent to exam  Discussed expectations with menopause and period  change. Questions addressed.  Continue follow up with PCP as indicated  Stressed SBE and yearly mammogram  Pap smear: yes   counseled on breast self exam, mammography screening, feminine hygiene, adequate intake of calcium and vitamin D, diet and exercise  return annually or prn  An After Visit Summary was printed and given to the patient.

## 2018-02-24 LAB — CYTOLOGY - PAP
Diagnosis: NEGATIVE
HPV: NOT DETECTED

## 2018-09-19 ENCOUNTER — Other Ambulatory Visit: Payer: Self-pay | Admitting: Certified Nurse Midwife

## 2018-09-19 DIAGNOSIS — Z1231 Encounter for screening mammogram for malignant neoplasm of breast: Secondary | ICD-10-CM

## 2018-11-04 ENCOUNTER — Ambulatory Visit: Payer: 59

## 2018-11-07 ENCOUNTER — Other Ambulatory Visit: Payer: Self-pay

## 2018-11-07 ENCOUNTER — Ambulatory Visit
Admission: RE | Admit: 2018-11-07 | Discharge: 2018-11-07 | Disposition: A | Discharge status: Home / Self Care | Payer: BC Managed Care – PPO | Source: Ambulatory Visit | Attending: Certified Nurse Midwife | Admitting: Certified Nurse Midwife

## 2018-11-07 ENCOUNTER — Ambulatory Visit: Payer: Self-pay

## 2018-11-07 DIAGNOSIS — Z1231 Encounter for screening mammogram for malignant neoplasm of breast: Secondary | ICD-10-CM

## 2019-05-25 ENCOUNTER — Encounter: Payer: Self-pay | Admitting: Certified Nurse Midwife

## 2020-03-15 ENCOUNTER — Other Ambulatory Visit: Payer: Self-pay | Admitting: Family Medicine

## 2020-03-15 DIAGNOSIS — Z1231 Encounter for screening mammogram for malignant neoplasm of breast: Secondary | ICD-10-CM

## 2020-04-26 ENCOUNTER — Other Ambulatory Visit: Payer: Self-pay

## 2020-04-26 ENCOUNTER — Ambulatory Visit
Admission: RE | Admit: 2020-04-26 | Discharge: 2020-04-26 | Disposition: A | Discharge status: Home / Self Care | Payer: BC Managed Care – PPO | Source: Ambulatory Visit | Attending: Family Medicine | Admitting: Family Medicine

## 2020-04-26 DIAGNOSIS — Z1231 Encounter for screening mammogram for malignant neoplasm of breast: Secondary | ICD-10-CM

## 2022-09-11 ENCOUNTER — Other Ambulatory Visit: Payer: Self-pay | Admitting: Family Medicine

## 2022-09-11 DIAGNOSIS — Z1231 Encounter for screening mammogram for malignant neoplasm of breast: Secondary | ICD-10-CM

## 2022-09-12 ENCOUNTER — Ambulatory Visit
Admission: RE | Admit: 2022-09-12 | Discharge: 2022-09-12 | Disposition: A | Discharge status: Home / Self Care | Payer: BC Managed Care – PPO | Source: Ambulatory Visit | Attending: Family Medicine | Admitting: Family Medicine

## 2022-09-12 DIAGNOSIS — Z1231 Encounter for screening mammogram for malignant neoplasm of breast: Secondary | ICD-10-CM
